# Patient Record
Sex: Female | Born: 2005 | Race: Black or African American | Hispanic: No | Marital: Single | Smoking: Never smoker
Health system: Southern US, Community
[De-identification: ages and names within clinical notes are randomized; demographics above are authoritative.]

---
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
## 2024-01-21 ENCOUNTER — Emergency Department (HOSPITAL_COMMUNITY)
Admission: EM | Admit: 2024-01-21 | Discharge: 2024-01-21 | Discharge status: Against Medical Advice | Attending: Emergency Medicine | Admitting: Emergency Medicine

## 2024-01-21 DIAGNOSIS — Z5321 Procedure and treatment not carried out due to patient leaving prior to being seen by health care provider: Secondary | ICD-10-CM | POA: Diagnosis not present

## 2024-01-21 DIAGNOSIS — M25562 Pain in left knee: Secondary | ICD-10-CM | POA: Insufficient documentation

## 2024-01-21 NOTE — ED Notes (Signed)
 Called patient name for triage 3x no answer

## 2024-01-21 NOTE — ED Triage Notes (Signed)
 The pt is c/o lt knee pain for 2 years worse today

## 2024-01-22 ENCOUNTER — Encounter (HOSPITAL_COMMUNITY): Payer: Self-pay

## 2024-01-22 ENCOUNTER — Ambulatory Visit (HOSPITAL_COMMUNITY)
Admission: EM | Admit: 2024-01-22 | Discharge: 2024-01-22 | Disposition: A | Discharge status: Home / Self Care | Attending: Nurse Practitioner | Admitting: Nurse Practitioner

## 2024-01-22 ENCOUNTER — Ambulatory Visit (INDEPENDENT_AMBULATORY_CARE_PROVIDER_SITE_OTHER)

## 2024-01-22 DIAGNOSIS — M25562 Pain in left knee: Secondary | ICD-10-CM | POA: Diagnosis not present

## 2024-01-22 NOTE — ED Provider Notes (Signed)
 [facility]    CSN: [id number] Arrival date & time: 01/22/24  1700      History   Chief Complaint Chief Complaint  Patient presents with   Knee Pain    HPI Jacqueline Foster is a 18 y.o. female.   Patient comes in for evaluation of left knee pain that has been ongoing for the past few days.  States she injured this knee years ago while playing sports.  At the time of impact when she tried to get, she endorsed some weakness in that knee and lower extremity.  States she fell again 2 days ago impacting her left knee with similar symptoms.  Was at work today standing, felt her knee pop, and had difficulty supporting her weight.  She is having discomfort and swelling to the knee.  The discomfort is radiating down her leg.  Has not taken any medications for the symptoms.  She has been wearing a knee brace without much improvement.  No other injuries or concerns reported.  The history is provided by the patient.  Knee Pain Associated symptoms: no fever     History reviewed. No pertinent past medical history.  There are no active problems to display for this patient.   History reviewed. No pertinent surgical history.  OB History   No obstetric history on file.      Home Medications    Prior to Admission medications   Medication Sig Start Date End Date Taking? Authorizing Provider  naproxen  (NAPROSYN ) 375 MG tablet Take 1 tablet (375 mg total) by mouth 2 (two) times daily with a meal. 12/26/22   Arloa Chroman, PA-C    Family History History reviewed. No pertinent family history.  Social History Social History   Tobacco Use   Smoking status: Never    Passive exposure: Current   Smokeless tobacco: Never  Substance Use Topics   Alcohol use: Yes    Comment: occ   Drug use: Yes    Types: Marijuana     Allergies   Patient has no known allergies.   Review of Systems Review of Systems  Constitutional:  Negative for chills and fever.  HENT:  Negative for  congestion, rhinorrhea and sore throat.   Eyes:  Negative for pain and redness.  Respiratory:  Negative for cough and shortness of breath.   Cardiovascular:  Negative for chest pain.  Gastrointestinal:  Negative for abdominal pain, diarrhea and vomiting.  Genitourinary:  Negative for dysuria and urgency.  Musculoskeletal:  Positive for arthralgias, gait problem, joint swelling and myalgias.  Skin:  Negative for rash and wound.  Neurological:  Negative for dizziness and headaches.  Psychiatric/Behavioral:  The patient is not nervous/anxious.      Physical Exam Triage Vital Signs ED Triage Vitals  Encounter Vitals Group     BP 01/22/24 1810 126/87     Girls Systolic BP Percentile --      Girls Diastolic BP Percentile --      Boys Systolic BP Percentile --      Boys Diastolic BP Percentile --      Pulse Rate 01/22/24 1810 81     Resp 01/22/24 1810 16     Temp 01/22/24 1810 98.1 F (36.7 C)     Temp Source 01/22/24 1810 Oral     SpO2 01/22/24 1810 97 %     Weight --      Height --      Head Circumference --      Peak Flow --  Pain Score 01/22/24 1808 10     Pain Loc --      Pain Education --      Exclude from Growth Chart --    No data found.  Updated Vital Signs BP 126/87 (BP Location: Left Arm)   Pulse 81   Temp 98.1 F (36.7 C) (Oral)   Resp 16   LMP 01/16/2024 (Approximate)   SpO2 97%   Visual Acuity Right Eye Distance:   Left Eye Distance:   Bilateral Distance:    Right Eye Near:   Left Eye Near:    Bilateral Near:     Physical Exam Vitals and nursing note reviewed.  Constitutional:      Appearance: Normal appearance.  HENT:     Head: Normocephalic.  Cardiovascular:     Rate and Rhythm: Normal rate and regular rhythm.     Heart sounds: No murmur heard. Pulmonary:     Effort: Pulmonary effort is normal.     Breath sounds: Normal breath sounds. No wheezing, rhonchi or rales.  Abdominal:     General: Bowel sounds are normal.  Musculoskeletal:      Left knee: Swelling and bony tenderness present. Decreased range of motion. Tenderness present over the medial joint line and MCL. Normal pulse.     Comments: Decreased ability to flex/extend against resistance.  Moderate discomfort with valgus stress maneuver.  No discomfort with palpation supra/infrapatellar region, lateral knee.  Skin:    General: Skin is warm and dry.  Neurological:     General: No focal deficit present.     Mental Status: She is alert and oriented to person, place, and time.  Psychiatric:        Mood and Affect: Mood normal.        Behavior: Behavior normal.        Thought Content: Thought content normal.        Judgment: Judgment normal.    UC Treatments / Results  Labs (all labs ordered are listed, but only abnormal results are displayed) Labs Reviewed - No data to display  EKG   Radiology DG Knee Complete 4 Views Left Result Date: 01/22/2024 CLINICAL DATA:  Knee injury EXAM: LEFT KNEE - COMPLETE 4+ VIEW COMPARISON:  12/2022 FINDINGS: Small knee effusion.  No fracture or.  Patent joint spaces. IMPRESSION: Small knee effusion. Electronically Signed   By: Luke Bun M.D.   On: 01/22/2024 18:30    Procedures Procedures (including critical care time)  Medications Ordered in UC Medications - No data to display  Initial Impression / Assessment and Plan / UC Course  I have reviewed the triage vital signs and the nursing notes.  Pertinent labs & imaging results that were available during my care of the patient were reviewed by me and considered in my medical decision making (see chart for details).    Patient presents for evaluation of her left knee after she reinjured it 2 days ago.  Reports a history of an injury years ago where she impacted her knee and had subsequent discomfort and weakness.  Clemens again 2 days ago onto her left knee with similar subsequent symptoms.  Standing at work today and felt her knee pop and subsequently could not bear weight  due to instability.  On physical exam, she does have swelling noted to the medial aspect of the knee and infrapatellar region.  Limited ability to flex and extend against resistance.  Moderate discomfort with palpation of the medial knee and moderate pain  with valgus stress.  Suspect possible MCL injury.  Patient was placed in a hinged knee brace.  Discussed use of scheduled interval dosing of ibuprofen  for the next 3 days.  RICE precautions and had limited activity when possible.  Discussed follow-up with orthopedics as soon as possible (information given) and work note was provided. Final Clinical Impressions(s) / UC Diagnoses   Final diagnoses:  Acute pain of left knee     Discharge Instructions      Plan follow up at [facility] for ongoing care of this injury.  Call to schedule an appointment. Take ibuprofen  600 mg with your breakfast, lunch, and dinner for the next 3 days. Apply ice to your knee and elevate your leg. Try to limit activity until evaluated by orthopedics.    ED Prescriptions   None    PDMP not reviewed this encounter.   Janet Therisa PARAS, FNP 01/22/24 1901

## 2024-01-22 NOTE — ED Triage Notes (Signed)
 Pt states fell 2 days ago and landed on lt knee. C/o pain to rt inner side of lt knee. States wore her knee brace with no relief. Denies taken any meds for sx's.

## 2024-01-22 NOTE — Discharge Instructions (Addendum)
 Plan follow up at [facility] for ongoing care of this injury.  Call to schedule an appointment. Take ibuprofen  600 mg with your breakfast, lunch, and dinner for the next 3 days. Apply ice to your knee and elevate your leg. Try to limit activity until evaluated by orthopedics.

## 2024-05-21 ENCOUNTER — Ambulatory Visit (HOSPITAL_BASED_OUTPATIENT_CLINIC_OR_DEPARTMENT_OTHER): Admit: 2024-05-21 | Admitting: Orthopedic Surgery

## 2024-05-21 ENCOUNTER — Encounter (HOSPITAL_BASED_OUTPATIENT_CLINIC_OR_DEPARTMENT_OTHER): Payer: Self-pay
